# Patient Record
Sex: Male | Born: 1990 | Race: White | Hispanic: Yes | Marital: Single | Smoking: Never smoker
Health system: Southern US, Community
[De-identification: ages and names within clinical notes are randomized; demographics above are authoritative.]

## PROBLEM LIST (undated history)

## (undated) DIAGNOSIS — K219 Gastro-esophageal reflux disease without esophagitis: Secondary | ICD-10-CM

## (undated) HISTORY — DX: Gastro-esophageal reflux disease without esophagitis: K21.9

---
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
## 2013-10-31 ENCOUNTER — Ambulatory Visit (INDEPENDENT_AMBULATORY_CARE_PROVIDER_SITE_OTHER): Payer: Managed Care, Other (non HMO) | Admitting: Family Medicine

## 2013-10-31 ENCOUNTER — Encounter (INDEPENDENT_AMBULATORY_CARE_PROVIDER_SITE_OTHER): Payer: Self-pay

## 2013-10-31 ENCOUNTER — Encounter: Payer: Self-pay | Admitting: Family Medicine

## 2013-10-31 VITALS — BP 121/71 | HR 78 | Temp 99.7°F | Ht 70.0 in | Wt 293.0 lb

## 2013-10-31 DIAGNOSIS — R110 Nausea: Secondary | ICD-10-CM

## 2013-10-31 LAB — POCT CBC
Granulocyte percent: 64.9 %G (ref 37–80)
HCT, POC: 49.2 % (ref 43.5–53.7)
Hemoglobin: 16.3 g/dL (ref 14.1–18.1)
Lymph, poc: 2.2 (ref 0.6–3.4)
MCH, POC: 28.5 pg (ref 27–31.2)
MCHC: 33.1 g/dL (ref 31.8–35.4)
MCV: 86.1 fL (ref 80–97)
MPV: 7.8 fL (ref 0–99.8)
POC Granulocyte: 4.8 (ref 2–6.9)
POC LYMPH PERCENT: 29.4 %L (ref 10–50)
Platelet Count, POC: 168 10*3/uL (ref 142–424)
RBC: 5.72 M/uL (ref 4.69–6.13)
RDW, POC: 12.4 %
WBC: 7.4 10*3/uL (ref 4.6–10.2)

## 2013-10-31 MED ORDER — OMEPRAZOLE 20 MG PO CPDR: 20.0000 mg | DELAYED_RELEASE_CAPSULE | Freq: Every day | ORAL | Status: DC

## 2013-10-31 NOTE — Progress Notes (Signed)
   Subjective:    Patient ID: Gerald Fuller, male    DOB: Feb 22, 1991, 23 y.o.   MRN: [id number]  HPI This 23 y.o. male presents for evaluation of routine check up.  He has been nauseated for 2-3 weeks.  Review of Systems    No chest pain, SOB, HA, dizziness, vision change, N/V, diarrhea, constipation, dysuria, urinary urgency or frequency, myalgias, arthralgias or rash.  Objective:   Physical Exam Vital signs noted  Well developed well nourished male.  HEENT - Head atraumatic Normocephalic                Eyes - PERRLA, Conjuctiva - clear Sclera- Clear EOMI                Ears - EAC's Wnl TM's Wnl Gross Hearing WNL                Opx - wnl Respiratory - Lungs CTA bilateral Cardiac - RRR S1 and S2 without murmur GI - Abdomen soft Nontender and bowel sounds active x 4 Extremities - No edema. Neuro - Grossly intact.       Assessment & Plan:  Nausea alone - Plan: US Abdomen Limited RUQ, omeprazole (PRILOSEC) 20 MG capsule, POCT CBC, CMP14+EGFR, Lipase  Take prilosec 70m po qd for next 2 weeks and then follow up.  Get [place] next week.  WLysbeth PennerFNP

## 2013-11-01 LAB — CMP14+EGFR
ALT: 34 IU/L (ref 0–44)
AST: 21 IU/L (ref 0–40)
Albumin/Globulin Ratio: 1.8 (ref 1.1–2.5)
Albumin: 4.8 g/dL (ref 3.5–5.5)
Alkaline Phosphatase: 88 IU/L (ref 39–117)
BUN/Creatinine Ratio: 15 (ref 8–19)
BUN: 15 mg/dL (ref 6–20)
CO2: 23 mmol/L (ref 18–29)
Calcium: 9.9 mg/dL (ref 8.7–10.2)
Chloride: 103 mmol/L (ref 97–108)
Creatinine, Ser: 0.97 mg/dL (ref 0.76–1.27)
GFR calc Af Amer: 126 mL/min/{1.73_m2} (ref >59)
GFR calc non Af Amer: 109 mL/min/{1.73_m2} (ref >59)
Globulin, Total: 2.6 g/dL (ref 1.5–4.5)
Glucose: 92 mg/dL (ref 65–99)
Potassium: 4.1 mmol/L (ref 3.5–5.2)
Sodium: 142 mmol/L (ref 134–144)
Total Bilirubin: 0.6 mg/dL (ref 0.0–1.2)
Total Protein: 7.4 g/dL (ref 6.0–8.5)

## 2013-11-01 LAB — LIPASE: Lipase: 28 U/L (ref 0–59)

## 2013-11-11 ENCOUNTER — Ambulatory Visit (HOSPITAL_COMMUNITY)
Admission: RE | Admit: 2013-11-11 | Discharge: 2013-11-11 | Disposition: A | Discharge status: Home / Self Care | Payer: Managed Care, Other (non HMO) | Source: Ambulatory Visit | Attending: Family Medicine | Admitting: Family Medicine

## 2013-11-11 DIAGNOSIS — K7689 Other specified diseases of liver: Secondary | ICD-10-CM | POA: Insufficient documentation

## 2013-11-11 DIAGNOSIS — R110 Nausea: Secondary | ICD-10-CM

## 2013-11-18 ENCOUNTER — Encounter: Payer: Self-pay | Admitting: Family Medicine

## 2013-11-18 ENCOUNTER — Ambulatory Visit (INDEPENDENT_AMBULATORY_CARE_PROVIDER_SITE_OTHER): Payer: Managed Care, Other (non HMO) | Admitting: Family Medicine

## 2013-11-18 VITALS — BP 114/73 | HR 75 | Temp 98.2°F | Ht 70.0 in | Wt 290.0 lb

## 2013-11-18 DIAGNOSIS — K219 Gastro-esophageal reflux disease without esophagitis: Secondary | ICD-10-CM

## 2013-11-18 DIAGNOSIS — R110 Nausea: Secondary | ICD-10-CM

## 2013-11-18 MED ORDER — OMEPRAZOLE 20 MG PO CPDR: 20.0000 mg | DELAYED_RELEASE_CAPSULE | Freq: Two times a day (BID) | ORAL | Status: DC

## 2013-11-18 NOTE — Progress Notes (Signed)
   Subjective:    Patient ID: Gerald Fuller, male    DOB: May 15, 1991, 23 y.o.   MRN: [id number]  HPI This 23 y.o. male presents for evaluation of GERD sx's and has been tx with .   Review of Systems No chest pain, SOB, HA, dizziness, vision change, N/V, diarrhea, constipation, dysuria, urinary urgency or frequency, myalgias, arthralgias or rash.     Objective:   Physical Exam Vital signs noted  Well developed well nourished male.  HEENT - Head atraumatic Normocephalic                Eyes - PERRLA, Conjuctiva - clear Sclera- Clear EOMI                Ears - EAC's Wnl TM's Wnl Gross Hearing WNL                Nose - Nares patent                 Throat - oropharanx wnl Respiratory - Lungs CTA bilateral Cardiac - RRR S1 and S2 without murmur GI - Abdomen soft Nontender and bowel sounds active x 4 Extremities - No edema. Neuro - Grossly intact.        Assessment & Plan:  Nausea alone - Plan: omeprazole (PRILOSEC) 20 MG capsule  GERD (gastroesophageal reflux disease) - Plan: omeprazole (PRILOSEC) 20 MG capsule  Deatra CanterWilliam J Oxford FNP

## 2014-12-18 ENCOUNTER — Encounter: Payer: Self-pay | Admitting: Family Medicine

## 2014-12-18 ENCOUNTER — Ambulatory Visit (INDEPENDENT_AMBULATORY_CARE_PROVIDER_SITE_OTHER): Payer: Managed Care, Other (non HMO) | Admitting: Family Medicine

## 2014-12-18 VITALS — BP 132/84 | HR 85 | Temp 98.9°F | Ht 70.0 in | Wt 286.0 lb

## 2014-12-18 DIAGNOSIS — R030 Elevated blood-pressure reading, without diagnosis of hypertension: Secondary | ICD-10-CM

## 2014-12-18 DIAGNOSIS — IMO0001 Reserved for inherently not codable concepts without codable children: Secondary | ICD-10-CM

## 2014-12-18 NOTE — Progress Notes (Signed)
Subjective:    Patient ID: Gerald Fuller, male    DOB: 10/20/1990, 24 y.o.   MRN: [id number]  HPI patient has checked his blood pressure at work and at a [employer]. He does not remember specific numbers but has impression it was elevated. He is asymptomatic. There is a positive family history with his mother having high blood pressure. His last reading in this office about a year ago was normotensive. Chief Complaint  Patient presents with  . Hypertension    Patient had 2 elevated readings at home    There are no active problems to display for this patient.  Outpatient Encounter Prescriptions as of 12/18/2014  Medication Sig  . [DISCONTINUED] omeprazole (PRILOSEC) 20 MG capsule Take 1 capsule (20 mg total) by mouth 2 (two) times daily before a meal.     Review of Systems  Constitutional: Negative.   HENT: Negative.   Eyes: Negative.   Respiratory: Negative.  Negative for shortness of breath.   Cardiovascular: Negative.  Negative for chest pain and leg swelling.  Gastrointestinal: Negative.   Genitourinary: Negative.   Musculoskeletal: Negative.   Skin: Negative.   Neurological: Negative.   Psychiatric/Behavioral: Negative.   All other systems reviewed and are negative.      Objective:   Physical Exam  Constitutional:  Patient is obese BMI greater than 41  Cardiovascular: Normal rate, regular rhythm, normal heart sounds and intact distal pulses.    BP 132/84 mmHg  Pulse 85  Temp(Src) 98.9 F (37.2 C) (Oral)  Ht 5' 10" (1.778 m)  Wt 286 lb (129.729 kg)  BMI 41.04 kg/m2        Assessment & Plan:  1. Elevated blood pressure Blood pressure is acceptable today. I have asked him to follow pressure whenever he has a chance watches weight in fact lose weight begin exercise program. Also given him some information regarding high blood pressure.  Wardell Honour MD - [facility]

## 2014-12-18 NOTE — Patient Instructions (Signed)

## 2014-12-19 LAB — BMP8+EGFR
BUN/Creatinine Ratio: 19 (ref 8–19)
BUN: 20 mg/dL (ref 6–20)
CHLORIDE: 105 mmol/L (ref 97–108)
CO2: 23 mmol/L (ref 18–29)
Calcium: 9.7 mg/dL (ref 8.7–10.2)
Creatinine, Ser: 1.03 mg/dL (ref 0.76–1.27)
GFR calc non Af Amer: 100 mL/min/{1.73_m2} (ref >59)
GFR, EST AFRICAN AMERICAN: 116 mL/min/{1.73_m2} (ref >59)
GLUCOSE: 96 mg/dL (ref 65–99)
POTASSIUM: 4.1 mmol/L (ref 3.5–5.2)
SODIUM: 144 mmol/L (ref 134–144)

## 2015-02-24 IMAGING — US US ABDOMEN LIMITED
1 series · 14 of 25 positions shown · non-contrast
Comparison: None.

CLINICAL DATA: Nausea

EXAM:
US ABDOMEN LIMITED - RIGHT UPPER QUADRANT

[Series 1: us abdomen limited · 0.27mm/px · 14 of 58 slices shown]
[im 1/58]
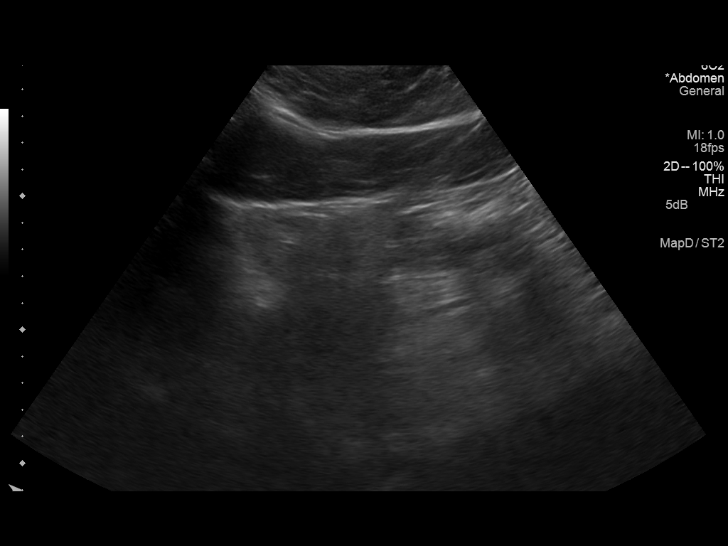
[im 5/58]
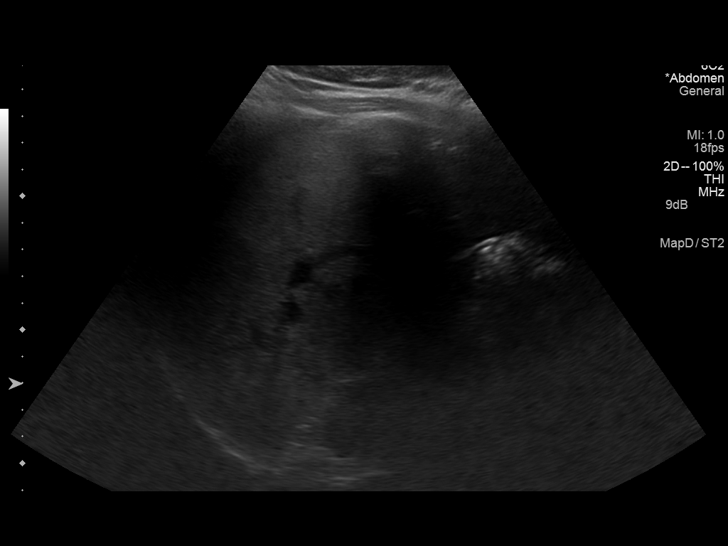
[im 10/58]
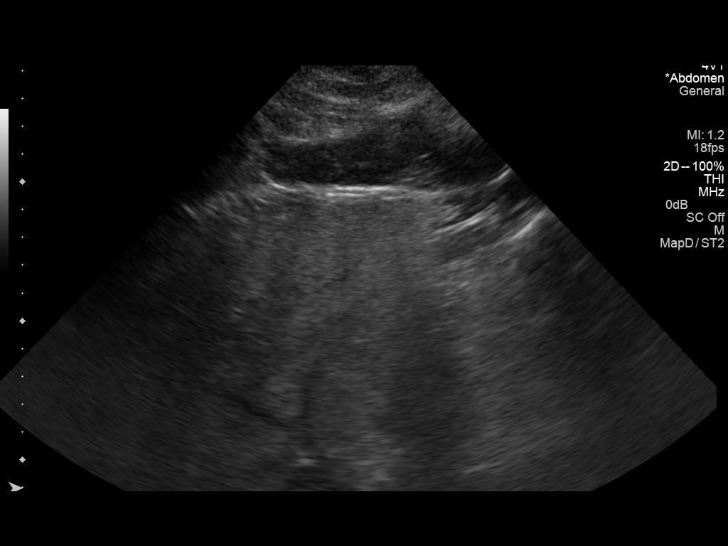
[im 15/58]
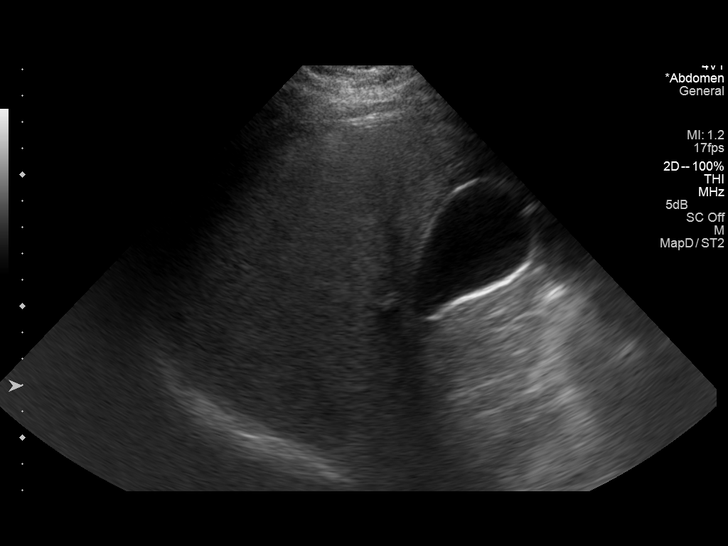
[im 20/58]
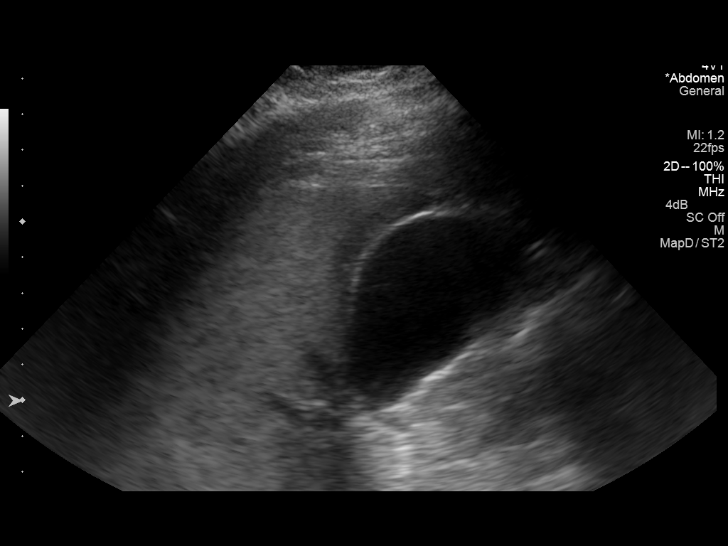
[im 22/58]
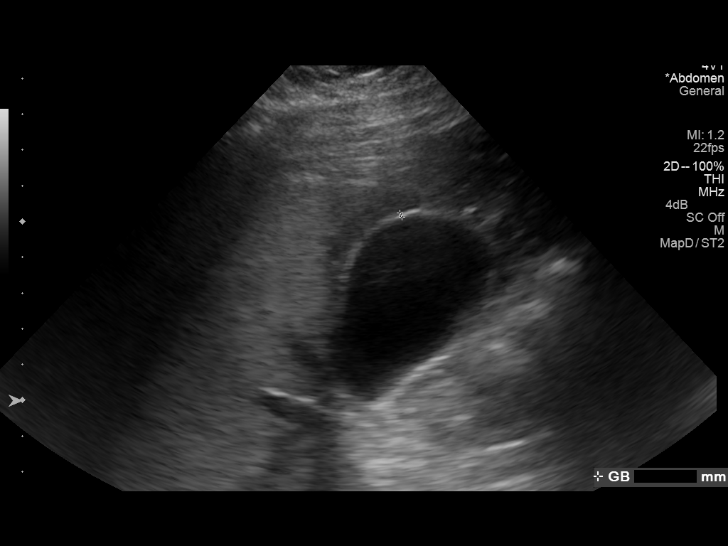
[im 27/58]
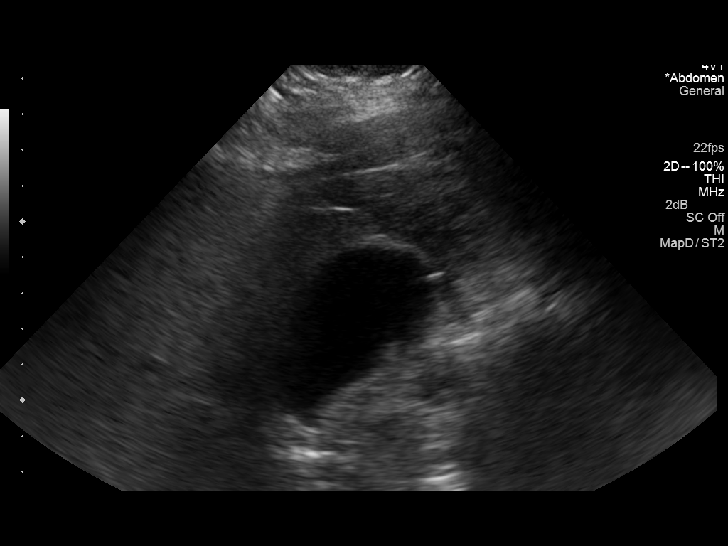
[im 31/58]
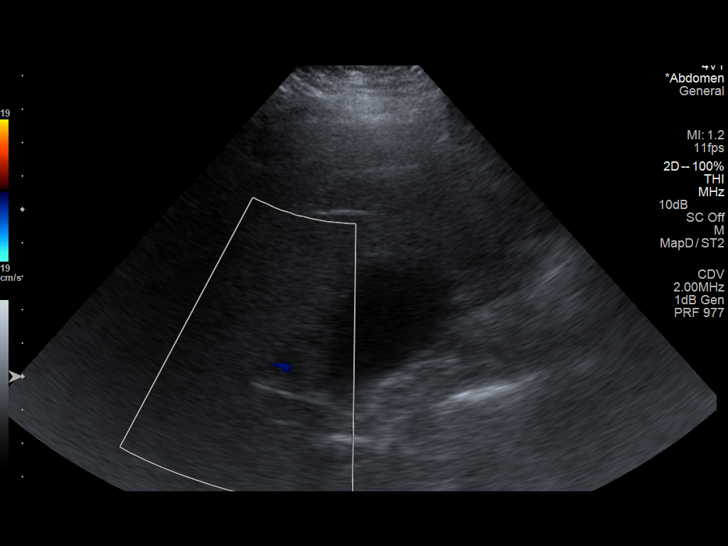
[im 36/58]
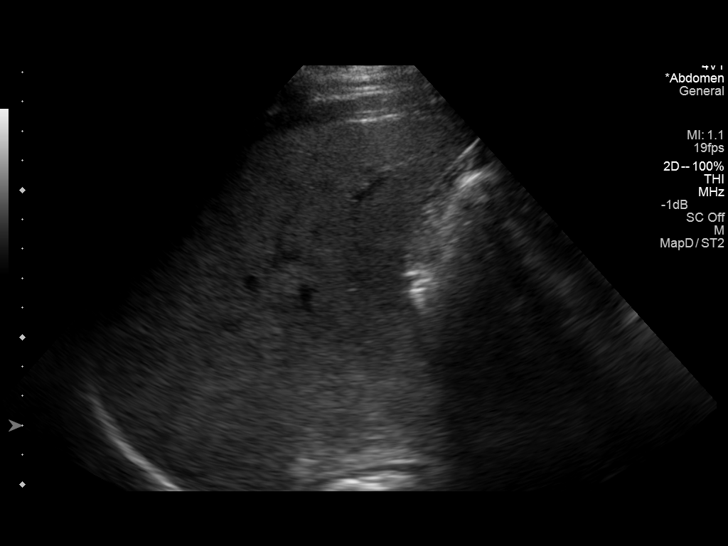
[im 39/58]
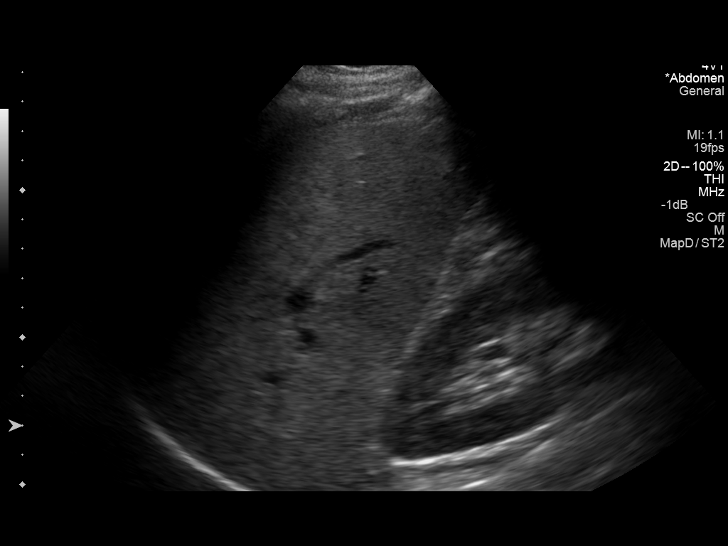
[im 43/58]
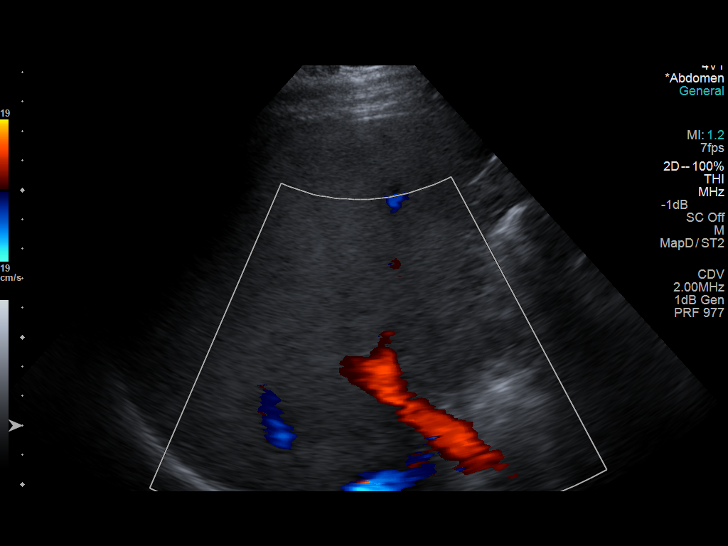
[im 48/58]
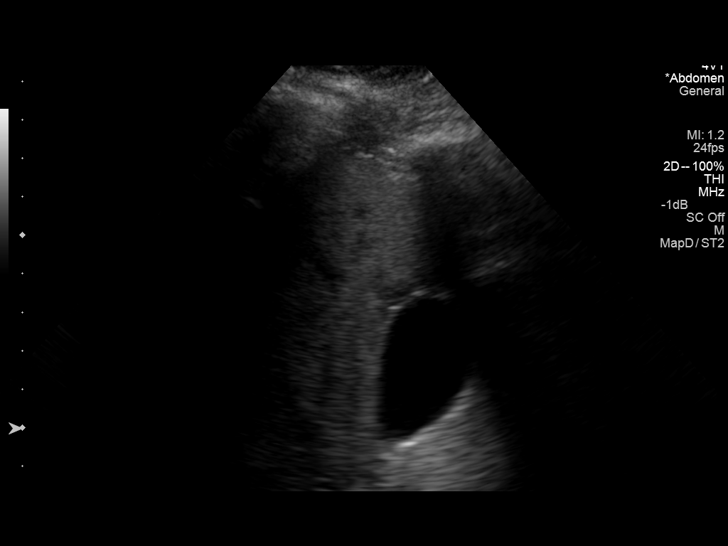
[im 53/58]
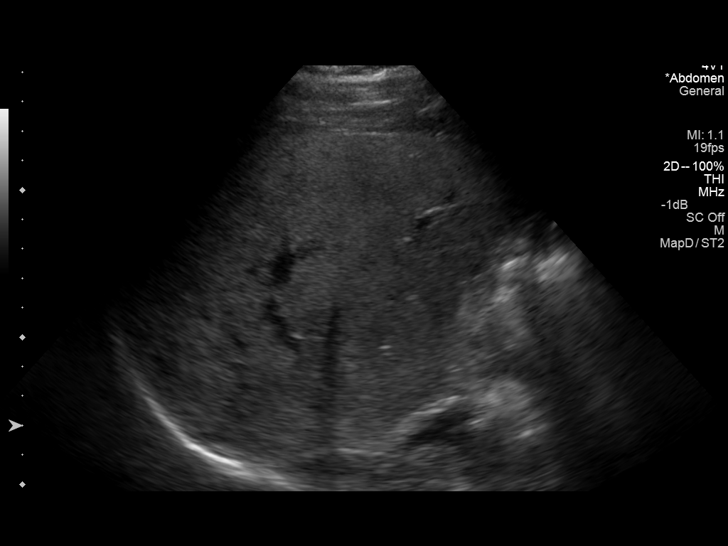
[im 58/58]
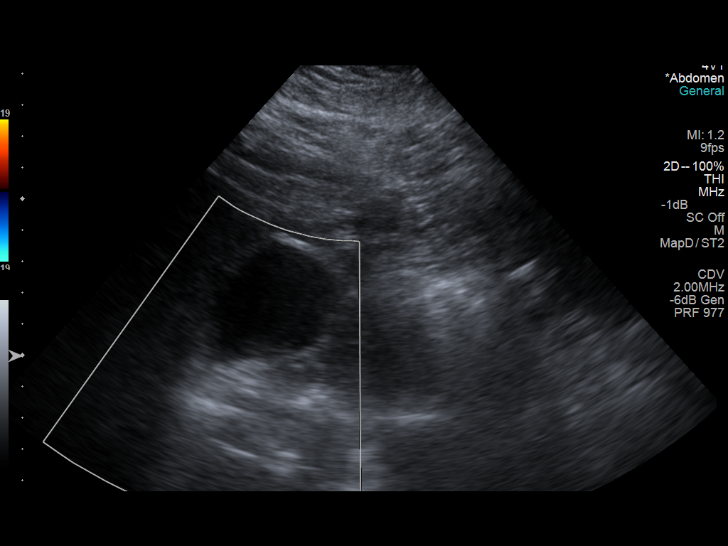

[14 of 25 positions shown; findings below may reference images not displayed]

FINDINGS: Gallbladder:

No gallstones, gallbladder wall thickening, or pericholecystic
fluid. Negative sonographic Murphy's sign.

Common bile duct:

Diameter: 6 mm, at the upper limits of normal.

Liver:

Hyperechoic hepatic parenchyma, reflecting hepatic steatosis, with
focal fatty sparing along the gallbladder fossa.
IMPRESSION: Hepatic steatosis with focal fatty sparing.

## 2015-03-18 ENCOUNTER — Ambulatory Visit (INDEPENDENT_AMBULATORY_CARE_PROVIDER_SITE_OTHER): Payer: Managed Care, Other (non HMO) | Admitting: Physician Assistant

## 2015-03-18 ENCOUNTER — Encounter: Payer: Self-pay | Admitting: Physician Assistant

## 2015-03-18 VITALS — BP 139/82 | HR 74 | Temp 97.6°F | Ht 70.0 in | Wt 294.0 lb

## 2015-03-18 DIAGNOSIS — L259 Unspecified contact dermatitis, unspecified cause: Secondary | ICD-10-CM

## 2015-03-18 MED ORDER — TRIAMCINOLONE ACETONIDE 0.1 % EX CREA: TOPICAL_CREAM | CUTANEOUS | Status: DC

## 2015-03-18 NOTE — Progress Notes (Signed)
   Subjective:    Patient ID: Marshun Duva, male    DOB: 09-Oct-1990, 24 y.o.   MRN: [id number]  HPI 24 y/o male presents with rash on bilateral arms x 1 week. He was working outside, mowing the yard 1-2 days prior. He has had poison oak in the past and symptoms are similar. Has tried Calamine lotion with some relief.    Review of Systems  Skin:       Itching rash on bilateral forearms        Objective:   Physical Exam  Constitutional: He is oriented to person, place, and time. He appears well-developed and well-nourished. No distress.  Neurological: He is alert and oriented to person, place, and time.  Skin: He is not diaphoretic.  Few linear and scattered pruritic papules on bilateral forearms  Negative for vesicles   Psychiatric: He has a normal mood and affect. His behavior is normal. Judgment and thought content normal.  Nursing note and vitals reviewed.         Assessment & Plan:  1. Contact dermatitis  - triamcinolone cream (KENALOG) 0.1 %; Apply to AA of itch bid-tid  Dispense: 80 g; Refill: 0   RTC if symptoms worsen or do not improve. Use dove soap   Cristiana Yochim A. Chauncey Reading PA-C

## 2017-11-23 ENCOUNTER — Encounter: Payer: Self-pay | Admitting: Family Medicine

## 2017-11-23 ENCOUNTER — Ambulatory Visit (INDEPENDENT_AMBULATORY_CARE_PROVIDER_SITE_OTHER): Payer: Managed Care, Other (non HMO) | Admitting: Family Medicine

## 2017-11-23 VITALS — BP 131/80 | HR 95 | Temp 98.2°F | Ht 70.0 in | Wt 308.0 lb

## 2017-11-23 DIAGNOSIS — Z0000 Encounter for general adult medical examination without abnormal findings: Secondary | ICD-10-CM | POA: Diagnosis not present

## 2017-11-23 NOTE — Progress Notes (Signed)
BP 131/80   Pulse 95   Temp 98.2 F (36.8 C) (Oral)   Ht 5' 10"  (1.778 m)   Wt (!) 308 lb (139.7 kg)   BMI 44.19 kg/m    Subjective:    Patient ID: Gerald Fuller, male    DOB: [birth year], 27 y.o.   MRN: [id number]  HPI: Gerald Fuller is a 27 y.o. male presenting on 11/23/2017 for Annual Exam   HPI Adult well exam and physical Patient is coming in today for adult well exam and physical.  He denies any major health issues.  He says he does get some numbness in his feet when he sitting for too long but as soon as he gets up it goes away.  He denies any other major health issues except the weight and obesity.  We talked about some strategies on way to reduce that and prevent diabetes and he will try to do some of them.. Patient denies any chest pain, shortness of breath, headaches or vision issues, abdominal complaints, diarrhea, nausea, vomiting, or joint issues.   Relevant past medical, surgical, family and social history reviewed and updated as indicated. Interim medical history since our last visit reviewed. Allergies and medications reviewed and updated.  Review of Systems  Constitutional: Negative for chills and fever.  HENT: Negative for ear pain and tinnitus.   Eyes: Negative for pain and discharge.  Respiratory: Negative for cough, shortness of breath and wheezing.   Cardiovascular: Negative for chest pain, palpitations and leg swelling.  Gastrointestinal: Negative for abdominal pain, blood in stool, constipation and diarrhea.  Genitourinary: Negative for dysuria and hematuria.  Musculoskeletal: Negative for back pain, gait problem and myalgias.  Skin: Negative for rash.  Neurological: Negative for dizziness, weakness and headaches.  Psychiatric/Behavioral: Negative for suicidal ideas.  All other systems reviewed and are negative.   Per HPI unless specifically indicated above   Allergies as of 11/23/2017      Reactions   Penicillins Rash      Medication List    as of  11/23/2017  2:42 PM   You have not been prescribed any medications.        Objective:    BP 131/80   Pulse 95   Temp 98.2 F (36.8 C) (Oral)   Ht 5' 10"  (1.778 m)   Wt (!) 308 lb (139.7 kg)   BMI 44.19 kg/m   Wt Readings from Last 3 Encounters:  11/23/17 (!) 308 lb (139.7 kg)  03/18/15 294 lb (133.4 kg)  12/18/14 286 lb (129.7 kg)    Physical Exam  Constitutional: He is oriented to person, place, and time. He appears well-developed and well-nourished. No distress.  HENT:  Right Ear: External ear normal.  Left Ear: External ear normal.  Nose: Nose normal.  Mouth/Throat: Oropharynx is clear and moist. No oropharyngeal exudate.  Eyes: Pupils are equal, round, and reactive to light. Conjunctivae and EOM are normal. No scleral icterus.  Neck: Neck supple. No thyromegaly present.  Cardiovascular: Normal rate, regular rhythm, normal heart sounds and intact distal pulses.  No murmur heard. Pulmonary/Chest: Effort normal and breath sounds normal. No respiratory distress. He has no wheezes.  Abdominal: Soft. Bowel sounds are normal. He exhibits no distension. There is no tenderness. There is no rebound and no guarding.  Musculoskeletal: Normal range of motion. He exhibits no edema.  Lymphadenopathy:    He has no cervical adenopathy.  Neurological: He is alert and oriented to person, place, and time. Coordination normal.  Skin: Skin is warm and dry. No rash noted. He is not diaphoretic.  Psychiatric: He has a normal mood and affect. His behavior is normal.  Vitals reviewed.       Assessment & Plan:   Problem List Items Addressed This Visit      Other   Morbid obesity ([place])   Relevant Orders   CBC with Differential/Platelet   Lipid panel   TSH    Other Visit Diagnoses    Well adult exam    -  Primary   Relevant Orders   CBC with Differential/Platelet   CMP14+EGFR   Lipid panel   TSH       Follow up plan: Return in about 1 year (around 11/24/2018), or if symptoms  worsen or fail to improve.  Counseling provided for all of the vaccine components Orders Placed This Encounter  Procedures  . CBC with Differential/Platelet  . CMP14+EGFR  . Lipid panel  . TSH    Caryl Pina, MD [facility] 11/23/2017, 2:42 PM

## 2017-11-24 LAB — CMP14+EGFR
ALBUMIN: 4.7 g/dL (ref 3.5–5.5)
ALK PHOS: 78 IU/L (ref 39–117)
ALT: 44 IU/L (ref 0–44)
AST: 28 IU/L (ref 0–40)
Albumin/Globulin Ratio: 1.7 (ref 1.2–2.2)
BUN / CREAT RATIO: 14 (ref 9–20)
BUN: 13 mg/dL (ref 6–20)
Bilirubin Total: 0.8 mg/dL (ref 0.0–1.2)
CALCIUM: 9.7 mg/dL (ref 8.7–10.2)
CO2: 25 mmol/L (ref 20–29)
CREATININE: 0.91 mg/dL (ref 0.76–1.27)
Chloride: 102 mmol/L (ref 96–106)
GFR calc Af Amer: 132 mL/min/{1.73_m2} (ref >59)
GFR, EST NON AFRICAN AMERICAN: 114 mL/min/{1.73_m2} (ref >59)
GLOBULIN, TOTAL: 2.7 g/dL (ref 1.5–4.5)
GLUCOSE: 86 mg/dL (ref 65–99)
Potassium: 3.7 mmol/L (ref 3.5–5.2)
SODIUM: 142 mmol/L (ref 134–144)
Total Protein: 7.4 g/dL (ref 6.0–8.5)

## 2017-11-24 LAB — CBC WITH DIFFERENTIAL/PLATELET
BASOS: 0 %
Basophils Absolute: 0 10*3/uL (ref 0.0–0.2)
EOS (ABSOLUTE): 0.1 10*3/uL (ref 0.0–0.4)
Eos: 1 %
Hematocrit: 49.9 % (ref 37.5–51.0)
Hemoglobin: 17.3 g/dL (ref 13.0–17.7)
IMMATURE GRANULOCYTES: 0 %
Immature Grans (Abs): 0 10*3/uL (ref 0.0–0.1)
Lymphocytes Absolute: 2.3 10*3/uL (ref 0.7–3.1)
Lymphs: 30 %
MCH: 29.6 pg (ref 26.6–33.0)
MCHC: 34.7 g/dL (ref 31.5–35.7)
MCV: 85 fL (ref 79–97)
Monocytes Absolute: 0.6 10*3/uL (ref 0.1–0.9)
Monocytes: 7 %
NEUTROS PCT: 62 %
Neutrophils Absolute: 4.7 10*3/uL (ref 1.4–7.0)
PLATELETS: 196 10*3/uL (ref 150–379)
RBC: 5.84 x10E6/uL — ABNORMAL HIGH (ref 4.14–5.80)
RDW: 13.8 % (ref 12.3–15.4)
WBC: 7.7 10*3/uL (ref 3.4–10.8)

## 2017-11-24 LAB — LIPID PANEL
CHOL/HDL RATIO: 4.4 ratio (ref 0.0–5.0)
CHOLESTEROL TOTAL: 194 mg/dL (ref 100–199)
HDL: 44 mg/dL (ref >39)
LDL CALC: 121 mg/dL — AB (ref 0–99)
TRIGLYCERIDES: 143 mg/dL (ref 0–149)
VLDL CHOLESTEROL CAL: 29 mg/dL (ref 5–40)

## 2017-11-24 LAB — TSH: TSH: 1.52 u[IU]/mL (ref 0.450–4.500)
